# Patient Record
Sex: Female | Born: 1995 | Race: Black or African American | Hispanic: No | Marital: Single | Smoking: Never smoker
Health system: Southern US, Community
[De-identification: ages and names within clinical notes are randomized; demographics above are authoritative.]

---
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
## 2020-11-24 ENCOUNTER — Other Ambulatory Visit: Payer: Self-pay

## 2020-11-24 ENCOUNTER — Encounter (HOSPITAL_COMMUNITY): Payer: Self-pay

## 2020-11-24 ENCOUNTER — Emergency Department (HOSPITAL_COMMUNITY)
Admission: EM | Admit: 2020-11-24 | Discharge: 2020-11-24 | Disposition: A | Discharge status: Home / Self Care | Payer: 59 | Attending: Emergency Medicine | Admitting: Emergency Medicine

## 2020-11-24 ENCOUNTER — Emergency Department (HOSPITAL_COMMUNITY): Payer: 59

## 2020-11-24 DIAGNOSIS — X19XXXA Contact with other heat and hot substances, initial encounter: Secondary | ICD-10-CM | POA: Insufficient documentation

## 2020-11-24 DIAGNOSIS — M5442 Lumbago with sciatica, left side: Secondary | ICD-10-CM

## 2020-11-24 DIAGNOSIS — U071 COVID-19: Secondary | ICD-10-CM | POA: Insufficient documentation

## 2020-11-24 DIAGNOSIS — T2122XA Burn of second degree of abdominal wall, initial encounter: Secondary | ICD-10-CM | POA: Diagnosis not present

## 2020-11-24 DIAGNOSIS — R0981 Nasal congestion: Secondary | ICD-10-CM | POA: Diagnosis present

## 2020-11-24 LAB — URINALYSIS, ROUTINE W REFLEX MICROSCOPIC
Bilirubin Urine: NEGATIVE
Glucose, UA: NEGATIVE mg/dL
Hgb urine dipstick: NEGATIVE
Ketones, ur: NEGATIVE mg/dL
Leukocytes,Ua: NEGATIVE
Nitrite: NEGATIVE
Protein, ur: NEGATIVE mg/dL
Specific Gravity, Urine: 1.012 (ref 1.005–1.030)
pH: 5 (ref 5.0–8.0)

## 2020-11-24 LAB — PREGNANCY, URINE: Preg Test, Ur: NEGATIVE

## 2020-11-24 LAB — SARS CORONAVIRUS 2 (TAT 6-24 HRS): SARS Coronavirus 2: POSITIVE — AB

## 2020-11-24 MED ORDER — IBUPROFEN 800 MG PO TABS
800.0000 mg | ORAL_TABLET | Freq: Once | ORAL | Status: AC
  Administered 2020-11-24: 800 mg via ORAL
  Filled 2020-11-24: qty 1

## 2020-11-24 MED ORDER — CEPHALEXIN 500 MG PO CAPS: 500.0000 mg | ORAL_CAPSULE | Freq: Four times a day (QID) | ORAL | 0 refills | Status: AC

## 2020-11-24 MED ORDER — METHOCARBAMOL 500 MG PO TABS: 500.0000 mg | ORAL_TABLET | Freq: Two times a day (BID) | ORAL | 0 refills | Status: AC

## 2020-11-24 NOTE — ED Triage Notes (Signed)
Pt to ED by POV from home with c/o lumbar back pain, radiating down the back of both legs. Pt states this started yesterday afternoon. Denies any injury or incontinence. Arrives A+O, VSS, NADN.

## 2020-11-24 NOTE — ED Provider Notes (Signed)
Emergency Medicine Provider Triage Evaluation Note  Diana Perkins , a 25 y.o. female  was evaluated in triage.  Pt complains of bilateral low back pain onset 2 PM after a nap.  Patient reports several days of nasal congestion body aches and cough.  Denies known COVID contacts.  Patient reports that pain radiates into her right leg down towards her knee.  Back pain is unchanged with movement or palpation.  Patient has been taking acetaminophen and Alka-Seltzer for her nasal congestion and body aches.  Review of Systems  Positive: No back pain, nasal congestion, body aches, cough Negative: Fever, chills, numbness, tingling, weakness, loss of bowel or bladder control  Physical Exam  BP 108/74   Pulse 74   Temp 98.6 F (37 C)   Resp 18   Ht 5\' 11"  (1.803 m)   Wt 54.4 kg   LMP 11/01/2020   SpO2 97%   BMI 16.74 kg/m   Gen:   Awake, no distress   Resp:  Normal effort  MSK:   Moves extremities without difficulty, normal gait Other:  Nasal congestion  Medical Decision Making  Medically screening exam initiated at 5:08 AM.  Appropriate orders placed.  Diana Perkins was informed that the remainder of the evaluation will be completed by another provider, this initial triage assessment does not replace that evaluation, and the importance of remaining in the ED until their evaluation is complete.  Acute bilateral low back pain with left-sided sciatica    Sharol Harness 11/24/20 11/26/20    [id number], MD 11/25/20 [phone]

## 2020-11-24 NOTE — Discharge Instructions (Signed)
At this time there does not appear to be the presence of an emergent medical condition, however there is always the potential for conditions to change. Please read and follow the below instructions.  Please return to the Emergency Department immediately for any new or worsening symptoms . Please be sure to follow up with your Primary Care Provider within one week regarding your visit today; please call their office to schedule an appointment even if you are feeling better for a follow-up visit. You may use the muscle relaxer Robaxin as prescribed to help with your symptoms.  Do not drive or operate heavy machinery while taking Robaxin as it will make you drowsy.  Do not drink alcohol or take other sedating medications while taking Robaxin as this will worsen side effects. Please take your antibiotic Keflex as prescribed until complete to help with your symptoms.  Please drink enough water to avoid dehydration and get plenty of rest. Please apply topical antibiotics like Neosporin to your wound and rinse gently with soap and water daily. Your COVID/influenza panel should result in the next 1 day.  Please discuss these results with your primary care provider.  Go to the nearest Emergency Department immediately if: You have fever or chills You have a red streak of skin near the area around your wound. Your wound has been closed with staples, sutures, skin glue, or adhesive strips and it begins to open up and separate. Your wound is bleeding, and the bleeding does not stop with gentle pressure. You have a rash. You faint. You have trouble breathing. You cannot control when you pee (urinate) or poop (have a bowel movement). You have weakness in any of these areas and it gets worse: Lower back. The area between your hip bones. Butt. Legs. You have redness or swelling of your back. You have a burning feeling when you pee. You have any new/concerning or worsening of symptoms   Please read the  additional information packets attached to your discharge summary.  Do not take your medicine if  develop an itchy rash, swelling in your mouth or lips, or difficulty breathing; call 911 and seek immediate emergency medical attention if this occurs.  You may review your lab tests and imaging results in their entirety on your MyChart account.  Please discuss all results of fully with your primary care provider and other specialist at your follow-up visit.  Note: Portions of this text may have been transcribed using voice recognition software. Every effort was made to ensure accuracy; however, inadvertent computerized transcription errors may still be present.

## 2020-11-24 NOTE — ED Provider Notes (Signed)
[facility]-EMERGENCY DEPT Provider Note   CSN: [id number] Arrival date & time: 11/24/20  0357     History Chief Complaint  Patient presents with  . Back Pain    Diana Perkins is a 24 y.o. female otherwise healthy female presents today for evaluation of right-sided low back pain onset 2 days ago.  Describes pain as sharp aching occasionally radiating down the right leg towards the knee, pain improves with Tylenol and OTC medications, worsens with certain movements.  Additionally patient reports that she has been experiencing nasal congestion and sneezing over the past few days.   Additionally patient would like a wound on her left flank evaluated.  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  Patient reports the area has been scabbing.  Denies fever/chills, fall/injury, cough/shortness of breath, chest pain, numbness/tingling, weakness, saddle or paresthesias, bowel/bladder incontinence, urinary retention, IV drug use or any additional concerns  HPI     History reviewed. No pertinent past medical history.  There are no problems to display for this patient.   History reviewed. No pertinent surgical history.   OB History   No obstetric history on file.     No family history on file.  Social History   Tobacco Use  . Smoking status: Never Smoker  . Smokeless tobacco: Never Used  Substance Use Topics  . Alcohol use: Yes  . Drug use: Never    Home Medications Prior to Admission medications   Medication Sig Start Date End Date Taking? Authorizing Provider  acetaminophen (TYLENOL) 325 MG tablet Take 650 mg by mouth every 6 (six) hours as needed for mild pain.   Yes [provider]  aspirin-sod bicarb-citric acid (ALKA-SELTZER) 325 MG TBEF tablet Take 325 mg by mouth every 6 (six) hours as needed (multiple symptoms).   Yes [provider]  cephALEXin (KEFLEX) 500 MG capsule Take  1 capsule (500 mg total) by mouth 4 (four) times daily for 7 days. 11/24/20 12/01/20 Yes Harlene Salts A, PA-C  methocarbamol (ROBAXIN) 500 MG tablet Take 1 tablet (500 mg total) by mouth 2 (two) times daily for 5 days. 11/24/20 11/29/20 Yes Harlene Salts A, PA-C    Allergies    Patient has no known allergies.  Review of Systems   Review of Systems Ten systems are reviewed and are negative for acute change except as noted in the HPI  Physical Exam Updated Vital Signs BP 108/81   Pulse 62   Temp 98.5 F (36.9 C)   Resp 15   Ht 5\' 11"  (1.803 m)   Wt 54.4 kg   LMP 11/01/2020   SpO2 96%   BMI 16.74 kg/m   Physical Exam Constitutional:      General: She is not in acute distress.    Appearance: Normal appearance. She is well-developed. She is not ill-appearing or diaphoretic.  HENT:     Head: Normocephalic and atraumatic.  Eyes:     General: Vision grossly intact. Gaze aligned appropriately.     Pupils: Pupils are equal, round, and reactive to light.  Neck:     Trachea: Trachea and phonation normal.  Cardiovascular:     Rate and Rhythm: Normal rate and regular rhythm.     Pulses:          Dorsalis pedis pulses are 2+ on the right side and 2+ on the left side.  Pulmonary:     Effort: Pulmonary effort  is normal. No respiratory distress.  Abdominal:     General: There is no distension.     Palpations: Abdomen is soft.     Tenderness: There is no abdominal tenderness. There is no guarding or rebound.  Musculoskeletal:        General: Normal range of motion.     Cervical back: Normal range of motion.     Comments: No midline C/T/L spinal tenderness to palpation, no deformity, crepitus, or step-off noted. No sign of injury to the neck or back. - Mild bilateral lumbar paraspinal muscular tenderness to palpation.  Feet:     Right foot:     Protective Sensation: 5 sites tested. 5 sites sensed.     Left foot:     Protective Sensation: 5 sites tested. 5 sites sensed.  Skin:     General: Skin is warm and dry.          Comments: Brand at the left flank, please refer to picture below.  Minimal erythema.  Scabbing present  Neurological:     Mental Status: She is alert.     GCS: GCS eye subscore is 4. GCS verbal subscore is 5. GCS motor subscore is 6.     Comments: Speech is clear and goal oriented, follows commands Major Cranial nerves without deficit, no facial droop Normal strength in upper and lower extremities bilaterally including dorsiflexion and plantar flexion, strong and equal grip strength Sensation normal to light and sharp touch Moves extremities without ataxia, coordination intact No clonus of the feet.  Psychiatric:        Behavior: Behavior normal.       ED Results / Procedures / Treatments   Labs (all labs ordered are listed, but only abnormal results are displayed) Labs Reviewed  SARS CORONAVIRUS 2 (TAT 6-24 HRS)  URINALYSIS, ROUTINE W REFLEX MICROSCOPIC  PREGNANCY, URINE    EKG None  Radiology DG Lumbar Spine Complete  Result Date: 11/24/2020 CLINICAL DATA:  Low back pain.  No known injury. EXAM: LUMBAR SPINE - COMPLETE 4+ VIEW COMPARISON:  None. FINDINGS: There is no evidence of lumbar spine fracture. Alignment is normal. Intervertebral disc spaces are maintained. IMPRESSION: Negative. Electronically Signed   By: Charlett Nose M.D.   On: 11/24/2020 08:29    Procedures Procedures   Medications Ordered in ED Medications  ibuprofen (ADVIL) tablet 800 mg (800 mg Oral Given 11/24/20 5361)    ED Course  I have reviewed the triage vital signs and the nursing notes.  Pertinent labs & imaging results that were available during my care of the patient were reviewed by me and considered in my medical decision making (see chart for details).    MDM Rules/Calculators/A&P                         Additional history obtained from: 1. Nursing notes from this visit. 2. Family, mother at bedside.  Diana Perkins is a 25 y.o. female  presenting with bilateral lower back pain with occasional right-sided sciatica.  Pain began 2 days ago.  Patient denies history of trauma, fever, IV drug use, night sweats, weight loss, cancer, saddle anesthesia, urinary rentention, bowel/bladder incontinence. No neurological deficits and normal neuro exam.  Patient had x-ray of the lumbar spine performed by previous provider, radiology interpretation as negative.  I personally reviewed those lumbar spine films and agree with radiologist, no obvious fracture or dislocation.  Additionally patient had urinalysis which was within normal limits,  no hemoglobin.  Pregnancy test was negative.  Pain is consistently reproducible with palpation of the paraspinal lumbar musculature.  Low suspicion for spinal epidural abscess, cauda equina, AAA, kidney stone disease or other emergent pathologies.  No indication for MRI at this time.  Patient reports pain is greatly improved since receiving ibuprofen by previous provider and would like discharge, feel this course of action is appropriate.  She will continue use of OTC anti-inflammatories, we will also provide course of Robaxin, discussed muscle lectures precautions with patient and she stated understanding.  Additionally patient has been experiencing some some sneezing and nasal congestion and requested COVID test.  That test is currently pending she denies any chest pain shortness of breath cough or hemoptysis.  Patient will follow up on her COVID/influenza panel on her MyChart account later on today and discussed those results with her primary care provider.  She does not wish to stay in the emergency department for those results which I feel is reasonable.  Finally patient branded herself for her sorority last week, no significant cellulitis is present to the area, some mild erythema may be early cellulitis.  Will start patient on course of Keflex and encourage close PCP follow-up for reevaluation and to ensure healing.   Also advised patient provide proper wound care and apply topical antibiotics to the area.  Patient reports Tdap is up-to-date.  At this time there does not appear to be any evidence of an acute emergency medical condition and the patient appears stable for discharge with appropriate outpatient follow up. Diagnosis was discussed with patient who verbalizes understanding of care plan and is agreeable to discharge. I have discussed return precautions with patient and mother who verbalizes understanding. Patient encouraged to follow-up with their PCP. All questions answered.   Note: Portions of this report may have been transcribed using voice recognition software. Every effort was made to ensure accuracy; however, inadvertent computerized transcription errors may still be present. Final Clinical Impression(s) / ED Diagnoses Final diagnoses:  Acute bilateral low back pain with left-sided sciatica  Partial thickness burn of flank, initial encounter    Rx / DC Orders ED Discharge Orders         Ordered    cephALEXin (KEFLEX) 500 MG capsule  4 times daily        11/24/20 0945    methocarbamol (ROBAXIN) 500 MG tablet  2 times daily        11/24/20 0945           Elizabeth Palau 11/24/20 0948    Wynetta Fines, MD 11/25/20 [phone]

## 2020-11-25 ENCOUNTER — Telehealth (HOSPITAL_COMMUNITY): Payer: Self-pay

## 2021-07-16 ENCOUNTER — Other Ambulatory Visit: Payer: Self-pay

## 2021-07-16 ENCOUNTER — Emergency Department (HOSPITAL_BASED_OUTPATIENT_CLINIC_OR_DEPARTMENT_OTHER): Payer: 59

## 2021-07-16 ENCOUNTER — Emergency Department (HOSPITAL_BASED_OUTPATIENT_CLINIC_OR_DEPARTMENT_OTHER)
Admission: EM | Admit: 2021-07-16 | Discharge: 2021-07-16 | Disposition: A | Discharge status: Home / Self Care | Payer: 59 | Attending: Emergency Medicine | Admitting: Emergency Medicine

## 2021-07-16 ENCOUNTER — Encounter (HOSPITAL_BASED_OUTPATIENT_CLINIC_OR_DEPARTMENT_OTHER): Payer: Self-pay | Admitting: Emergency Medicine

## 2021-07-16 DIAGNOSIS — R112 Nausea with vomiting, unspecified: Secondary | ICD-10-CM | POA: Diagnosis present

## 2021-07-16 DIAGNOSIS — Z20822 Contact with and (suspected) exposure to covid-19: Secondary | ICD-10-CM | POA: Diagnosis not present

## 2021-07-16 DIAGNOSIS — Z79899 Other long term (current) drug therapy: Secondary | ICD-10-CM | POA: Insufficient documentation

## 2021-07-16 DIAGNOSIS — R1084 Generalized abdominal pain: Secondary | ICD-10-CM | POA: Diagnosis not present

## 2021-07-16 DIAGNOSIS — R000 Tachycardia, unspecified: Secondary | ICD-10-CM | POA: Insufficient documentation

## 2021-07-16 DIAGNOSIS — E872 Acidosis, unspecified: Secondary | ICD-10-CM | POA: Diagnosis not present

## 2021-07-16 LAB — CBC WITH DIFFERENTIAL/PLATELET
Abs Immature Granulocytes: 0.01 10*3/uL (ref 0.00–0.07)
Basophils Absolute: 0 10*3/uL (ref 0.0–0.1)
Basophils Relative: 0 %
Eosinophils Absolute: 0 10*3/uL (ref 0.0–0.5)
Eosinophils Relative: 0 %
HCT: 38.8 % (ref 36.0–46.0)
Hemoglobin: 13.8 g/dL (ref 12.0–15.0)
Immature Granulocytes: 0 %
Lymphocytes Relative: 22 %
Lymphs Abs: 1.1 10*3/uL (ref 0.7–4.0)
MCH: 28.8 pg (ref 26.0–34.0)
MCHC: 35.6 g/dL (ref 30.0–36.0)
MCV: 81 fL (ref 80.0–100.0)
Monocytes Absolute: 0.3 10*3/uL (ref 0.1–1.0)
Monocytes Relative: 6 %
Neutro Abs: 3.5 10*3/uL (ref 1.7–7.7)
Neutrophils Relative %: 72 %
Platelets: 327 10*3/uL (ref 150–400)
RBC: 4.79 MIL/uL (ref 3.87–5.11)
RDW: 12.8 % (ref 11.5–15.5)
WBC: 4.9 10*3/uL (ref 4.0–10.5)
nRBC: 0 % (ref 0.0–0.2)

## 2021-07-16 LAB — I-STAT VENOUS BLOOD GAS, ED
Acid-base deficit: 1 mmol/L (ref 0.0–2.0)
Bicarbonate: 24.2 mmol/L (ref 20.0–28.0)
Calcium, Ion: 1.05 mmol/L — ABNORMAL LOW (ref 1.15–1.40)
HCT: 39 % (ref 36.0–46.0)
Hemoglobin: 13.3 g/dL (ref 12.0–15.0)
O2 Saturation: 60 %
Patient temperature: 98.4
Potassium: 3.7 mmol/L (ref 3.5–5.1)
Sodium: 140 mmol/L (ref 135–145)
TCO2: 25 mmol/L (ref 22–32)
pCO2, Ven: 40.2 mmHg — ABNORMAL LOW (ref 44.0–60.0)
pH, Ven: 7.387 (ref 7.250–7.430)
pO2, Ven: 32 mmHg (ref 32.0–45.0)

## 2021-07-16 LAB — RAPID URINE DRUG SCREEN, HOSP PERFORMED
Amphetamines: NOT DETECTED
Barbiturates: NOT DETECTED
Benzodiazepines: NOT DETECTED
Cocaine: NOT DETECTED
Opiates: NOT DETECTED
Tetrahydrocannabinol: POSITIVE — AB

## 2021-07-16 LAB — URINALYSIS, ROUTINE W REFLEX MICROSCOPIC
Bilirubin Urine: NEGATIVE
Glucose, UA: NEGATIVE mg/dL
Hgb urine dipstick: NEGATIVE
Ketones, ur: 80 mg/dL — AB
Leukocytes,Ua: NEGATIVE
Nitrite: NEGATIVE
Protein, ur: NEGATIVE mg/dL
Specific Gravity, Urine: 1.02 (ref 1.005–1.030)
pH: 8.5 — ABNORMAL HIGH (ref 5.0–8.0)

## 2021-07-16 LAB — COMPREHENSIVE METABOLIC PANEL
ALT: 42 U/L (ref 0–44)
AST: 42 U/L — ABNORMAL HIGH (ref 15–41)
Albumin: 4.6 g/dL (ref 3.5–5.0)
Alkaline Phosphatase: 51 U/L (ref 38–126)
Anion gap: 16 — ABNORMAL HIGH (ref 5–15)
BUN: 7 mg/dL (ref 6–20)
CO2: 20 mmol/L — ABNORMAL LOW (ref 22–32)
Calcium: 8.9 mg/dL (ref 8.9–10.3)
Chloride: 102 mmol/L (ref 98–111)
Creatinine, Ser: 0.92 mg/dL (ref 0.44–1.00)
GFR, Estimated: >60 mL/min (ref >60)
Glucose, Bld: 125 mg/dL — ABNORMAL HIGH (ref 70–99)
Potassium: 3.6 mmol/L (ref 3.5–5.1)
Sodium: 138 mmol/L (ref 135–145)
Total Bilirubin: 1.5 mg/dL — ABNORMAL HIGH (ref 0.3–1.2)
Total Protein: 7.6 g/dL (ref 6.5–8.1)

## 2021-07-16 LAB — RESP PANEL BY RT-PCR (FLU A&B, COVID) ARPGX2
Influenza A by PCR: NEGATIVE
Influenza B by PCR: NEGATIVE
SARS Coronavirus 2 by RT PCR: NEGATIVE

## 2021-07-16 LAB — LACTIC ACID, PLASMA
Lactic Acid, Venous: 3.7 mmol/L (ref 0.5–1.9)
Lactic Acid, Venous: 1.5 mmol/L (ref 0.5–1.9)

## 2021-07-16 LAB — CBG MONITORING, ED: Glucose-Capillary: 119 mg/dL — ABNORMAL HIGH (ref 70–99)

## 2021-07-16 LAB — LIPASE, BLOOD: Lipase: 36 U/L (ref 11–51)

## 2021-07-16 LAB — MAGNESIUM: Magnesium: 1.6 mg/dL — ABNORMAL LOW (ref 1.7–2.4)

## 2021-07-16 LAB — TROPONIN I (HIGH SENSITIVITY)
Troponin I (High Sensitivity): <2 ng/L (ref <18)
Troponin I (High Sensitivity): 3 ng/L (ref <18)

## 2021-07-16 LAB — PREGNANCY, URINE: Preg Test, Ur: NEGATIVE

## 2021-07-16 MED ORDER — PROMETHAZINE HCL 25 MG/ML IJ SOLN
INTRAMUSCULAR | Status: AC
  Filled 2021-07-16: qty 1

## 2021-07-16 MED ORDER — LACTATED RINGERS IV BOLUS
1000.0000 mL | Freq: Once | INTRAVENOUS | Status: AC
Start: 2021-07-16 — End: 2021-07-16
  Administered 2021-07-16: 1000 mL via INTRAVENOUS

## 2021-07-16 MED ORDER — DROPERIDOL 2.5 MG/ML IJ SOLN
2.5000 mg | Freq: Once | INTRAMUSCULAR | Status: AC
  Administered 2021-07-16: 2.5 mg via INTRAVENOUS
  Filled 2021-07-16: qty 2

## 2021-07-16 MED ORDER — SODIUM CHLORIDE 0.9 % IV BOLUS
1000.0000 mL | Freq: Once | INTRAVENOUS | Status: AC
  Administered 2021-07-16: 1000 mL via INTRAVENOUS

## 2021-07-16 MED ORDER — MAGNESIUM SULFATE 2 GM/50ML IV SOLN
2.0000 g | Freq: Once | INTRAVENOUS | Status: AC
  Administered 2021-07-16: 2 g via INTRAVENOUS
  Filled 2021-07-16: qty 50

## 2021-07-16 MED ORDER — ONDANSETRON HCL 4 MG/2ML IJ SOLN
4.0000 mg | Freq: Once | INTRAMUSCULAR | Status: AC
  Administered 2021-07-16: 4 mg via INTRAVENOUS
  Filled 2021-07-16: qty 2

## 2021-07-16 MED ORDER — IOHEXOL 300 MG/ML  SOLN
100.0000 mL | Freq: Once | INTRAMUSCULAR | Status: AC | PRN
  Administered 2021-07-16: 80 mL via INTRAVENOUS

## 2021-07-16 MED ORDER — ONDANSETRON 4 MG PO TBDP: 4.0000 mg | ORAL_TABLET | Freq: Three times a day (TID) | ORAL | 0 refills | Status: DC | PRN

## 2021-07-16 MED ORDER — SODIUM CHLORIDE 0.9 % IV SOLN
12.5000 mg | Freq: Once | INTRAVENOUS | Status: AC
  Administered 2021-07-16: 12.5 mg via INTRAVENOUS
  Filled 2021-07-16: qty 0.5

## 2021-07-16 NOTE — Discharge Instructions (Addendum)
It was our pleasure to provide your ER care today - we hope that you feel better.  As we discussed we suspect your vomiting is likely secondary to marijuana use.  You should stop using this and follow-up with your primary doctor.  He may need to see a gastroenterologist if your vomiting continues.  Take the anti-nausea medication as prescribed.  Avoid alcohol, caffeine, NSAID medications, spicy foods. Return to the ED with intractable pain, intractable vomiting, black or bloody stools, dizziness or lightheadedness or any other concerns.

## 2021-07-16 NOTE — ED Provider Notes (Signed)
Signed out that workup done and d/c instructions complete, pt receiving fluids, to d/c home if no recurrent/persistent vomiting.   Pt notes feels improved. No current pain or nv.   Patient appears stable for d/c.   Return precautions provided.      Lajean Saver, MD 07/16/21 [phone]

## 2021-07-16 NOTE — ED Triage Notes (Signed)
Reports n/v that comes and goes since christmas eve.

## 2021-07-16 NOTE — ED Notes (Signed)
Patient resting in bed with eyes closed but arousable to voice.  PO fluids offered.  Patient appears disinterested and continues to rest with eyes closed.

## 2021-07-16 NOTE — ED Provider Notes (Addendum)
[facility] EMERGENCY DEPARTMENT Provider Note   CSN: [id number] Arrival date & time: 07/16/21  0149     History  Chief Complaint  Patient presents with   Vomiting    Diana Perkins is a 26 y.o. female.  Patient with no chronic medical conditions presenting with intermittent nausea and vomiting since December 25.  States she had multiple episodes of vomiting on December 24 and 25 which seem to improve after 2 days.  However this symptoms returned on December 27 and she has been vomiting ever since.  Unable to quantify how many times she has been vomiting.  Reports it is not bloody.  Has not had any diarrhea.  States she is unable to eat or drink anything except intermittent chicken noodle soup.  Does not believe she is had a fever.  Does not think she had any diarrhea.  Does have some burning in her stomach and chest and vomiting.  No pain with urination or blood in the urine.  She does admit to recreational marijuana use.  Denies any possibility of pregnancy. Denies any travel or sick contacts. No history of GERD or ulcers. No recent antibiotic use. No previous abdominal surgeries.  No regular medications.  The history is provided by the patient and a relative.      Home Medications Prior to Admission medications   Medication Sig Start Date End Date Taking? Authorizing Provider  acetaminophen (TYLENOL) 325 MG tablet Take 650 mg by mouth every 6 (six) hours as needed for mild pain.    [provider]  aspirin-sod bicarb-citric acid (ALKA-SELTZER) 325 MG TBEF tablet Take 325 mg by mouth every 6 (six) hours as needed (multiple symptoms).    [provider]      Allergies    Amoxicillin    Review of Systems   Review of Systems  Constitutional:  Positive for activity change. Negative for appetite change, fatigue and fever.  HENT:  Negative for congestion and rhinorrhea.   Respiratory:  Negative for cough and shortness of breath.   Cardiovascular:   Negative for chest pain.  Gastrointestinal:  Positive for abdominal pain, nausea and vomiting. Negative for diarrhea.  Genitourinary:  Negative for dysuria and hematuria.  Musculoskeletal:  Negative for arthralgias and myalgias.  Skin:  Negative for rash.  Neurological:  Positive for weakness. Negative for dizziness and headaches.   all other systems are negative except as noted in the HPI and PMH.   Physical Exam Updated Vital Signs BP 138/80 (BP Location: Right Arm)    Pulse 65    Temp 98.4 F (36.9 C) (Oral)    Resp 20    Ht 5\' 11"  (1.803 m)    Wt 49.2 kg    LMP 07/08/2021    SpO2 100%    BMI 15.12 kg/m  Physical Exam Vitals and nursing note reviewed.  Constitutional:      General: She is not in acute distress.    Appearance: She is well-developed. She is ill-appearing.     Comments: Appears unwell, shaking chills  HENT:     Head: Normocephalic and atraumatic.     Mouth/Throat:     Mouth: Mucous membranes are dry.     Pharynx: No oropharyngeal exudate.  Eyes:     Conjunctiva/sclera: Conjunctivae normal.     Pupils: Pupils are equal, round, and reactive to light.  Neck:     Comments: No meningismus. Cardiovascular:     Rate and Rhythm: Regular rhythm. Tachycardia present.  Heart sounds: Normal heart sounds. No murmur heard. Pulmonary:     Effort: Pulmonary effort is normal. No respiratory distress.     Breath sounds: Normal breath sounds.  Abdominal:     Palpations: Abdomen is soft.     Tenderness: There is abdominal tenderness. There is no guarding or rebound.     Comments: Mild diffuse tenderness  Musculoskeletal:        General: No tenderness. Normal range of motion.     Cervical back: Normal range of motion and neck supple.  Skin:    General: Skin is warm.  Neurological:     Mental Status: She is alert and oriented to person, place, and time.     Cranial Nerves: No cranial nerve deficit.     Motor: No abnormal muscle tone.     Coordination: Coordination  normal.     Comments:  5/5 strength throughout. CN 2-12 intact.Equal grip strength.   Psychiatric:        Behavior: Behavior normal.    ED Results / Procedures / Treatments   Labs (all labs ordered are listed, but only abnormal results are displayed) Labs Reviewed  URINALYSIS, ROUTINE W REFLEX MICROSCOPIC - Abnormal; Notable for the following components:      Result Value   APPearance HAZY (*)    pH 8.5 (*)    Ketones, ur 80 (*)    All other components within normal limits  LACTIC ACID, PLASMA - Abnormal; Notable for the following components:   Lactic Acid, Venous 3.7 (*)    All other components within normal limits  COMPREHENSIVE METABOLIC PANEL - Abnormal; Notable for the following components:   CO2 20 (*)    Glucose, Bld 125 (*)    AST 42 (*)    Total Bilirubin 1.5 (*)    Anion gap 16 (*)    All other components within normal limits  RAPID URINE DRUG SCREEN, HOSP PERFORMED - Abnormal; Notable for the following components:   Tetrahydrocannabinol POSITIVE (*)    All other components within normal limits  MAGNESIUM - Abnormal; Notable for the following components:   Magnesium 1.6 (*)    All other components within normal limits  CBG MONITORING, ED - Abnormal; Notable for the following components:   Glucose-Capillary 119 (*)    All other components within normal limits  I-STAT VENOUS BLOOD GAS, ED - Abnormal; Notable for the following components:   pCO2, Ven 40.2 (*)    Calcium, Ion 1.05 (*)    All other components within normal limits  RESP PANEL BY RT-PCR (FLU A&B, COVID) ARPGX2  PREGNANCY, URINE  LACTIC ACID, PLASMA  CBC WITH DIFFERENTIAL/PLATELET  LIPASE, BLOOD  TROPONIN I (HIGH SENSITIVITY)  TROPONIN I (HIGH SENSITIVITY)    EKG EKG Interpretation  Date/Time:  Monday July 16 2021 02:27:12 EST Ventricular Rate:  62 PR Interval:  156 QRS Duration: 81 QT Interval:  439 QTC Calculation: 446 R Axis:   71 Text Interpretation: Sinus rhythm Nonspecific T  abnormalities, anterior leads No previous ECGs available Confirmed by Ezequiel Essex [phone]) on 07/16/2021 3:15:42 AM  Radiology DG Chest 2 View  Result Date: 07/16/2021 CLINICAL DATA:  Nausea and vomiting with cough. EXAM: CHEST - 2 VIEW COMPARISON:  None. FINDINGS: The heart size and mediastinal contours are within normal limits. Both lungs are clear. A radiopaque nipple piercing is seen on the left. The visualized skeletal structures are unremarkable. IMPRESSION: No active cardiopulmonary disease. Electronically Signed   By: Joyce Gross.D.  On: 07/16/2021 04:01   CT ABDOMEN PELVIS W CONTRAST  Result Date: 07/16/2021 CLINICAL DATA:  Nausea and vomiting. EXAM: CT ABDOMEN AND PELVIS WITH CONTRAST TECHNIQUE: Multidetector CT imaging of the abdomen and pelvis was performed using the standard protocol following bolus administration of intravenous contrast. CONTRAST:  80mL OMNIPAQUE IOHEXOL 300 MG/ML  SOLN COMPARISON:  None. FINDINGS: Lower chest: No acute abnormality. Hepatobiliary: 16.7 cm in length slightly steatotic liver. No mass enhancement. Unremarkable gallbladder and bile ducts. Pancreas: Unremarkable. No pancreatic ductal dilatation or surrounding inflammatory changes. Spleen: Normal. Adrenals/Urinary Tract: There is no adrenal mass no focal abnormality in the renal cortex and no calculus or hydroureteronephrosis. There is mild bladder thickening versus nondistention. Stomach/Bowel: There is fold thickening in the proximal and mid stomach. Fold thickening is seen in multiple mid to lower abdominal small bowel segments without dilatation. The appendix is normal caliber. There is wall thickening or underdistention in the descending colon. Vascular/Lymphatic: No significant vascular findings are present. No enlarged abdominal or pelvic lymph nodes. Reproductive: Uterus and bilateral adnexa are unremarkable. There is air in the vaginal vault, possibly due to a tampon. Other: No anterior wall hernia  is seen. There is a small amount of low-density fluid in the pelvic cul-de-sac. There is no free air, hemorrhage or abscess. Musculoskeletal: No acute or significant osseous findings. IMPRESSION: 1. Imaging findings of gastroenteritis. No small bowel obstruction or mesenteric focal inflammatory changes. 2. Descending colitis versus nondistention. 3. Minimal low-density fluid in the pelvic cul-de-sac. 4. Cystitis versus bladder nondistention. No findings of pyelonephritis. Electronically Signed   By: Almira BarKeith  Chesser M.D.   On: 07/16/2021 04:19    Procedures Procedures    Medications Ordered in ED Medications  sodium chloride 0.9 % bolus 1,000 mL (has no administration in time range)  ondansetron (ZOFRAN) injection 4 mg (has no administration in time range)    ED Course/ Medical Decision Making/ A&P                           Medical Decision Making Intermittent nausea and vomiting since December 27.  Vital stable.  Afebrile.  Abdomen soft without peritoneal signs.  Lactate elevated at 3.7.  Patient given aggressive IV hydration.  EKG does show T wave inversions anteriorly, no comparison. Mild anion gap acidosis with bicarb of 20 and gap of 16  IV fluids given.  Hypomagnesemia noted and replaced. Urinalysis with large ketones.  Drug screen is positive for marijuana only.  Imaging which was personally reviewed by me shows evidence of gastroenteritis without bowel obstruction.  Lactate has normalized on rechecked. Abdomen soft without peritoneal signs.   CT scan results above consistent with likely gastroenteritis versus colonic nondistention. Patient has not had any diarrhea so doubt colitis.  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 Advised cessation of marijuana products. Advise close PCP follow-up and antiemetics for home use. Patient did have some transient hypotension numbers in the 80s but this was likely positional and she was sleeping at the time.   Despite elevated lactate low suspicion for sepsis or septic shock.  Subsequent blood pressure numbers were normal.  On attempted discharge, patient began vomiting again and was given additional doses of antiemetics including droperidol. Care to be transferred at shift change. Dr. Denton LankSteinl to assume care.         Final Clinical Impression(s) / ED Diagnoses Final diagnoses:  Nausea and vomiting, unspecified vomiting type  Lactic acidosis  Rx / DC Orders ED Discharge Orders     None         Ledell Codrington, Annie Main, MD 07/16/21 Wonda Amis    Ezequiel Essex, MD 07/17/21 1032

## 2021-07-17 ENCOUNTER — Encounter (HOSPITAL_BASED_OUTPATIENT_CLINIC_OR_DEPARTMENT_OTHER): Payer: Self-pay | Admitting: Emergency Medicine

## 2021-07-17 ENCOUNTER — Emergency Department (HOSPITAL_BASED_OUTPATIENT_CLINIC_OR_DEPARTMENT_OTHER)
Admission: EM | Admit: 2021-07-17 | Discharge: 2021-07-17 | Disposition: A | Discharge status: Home / Self Care | Payer: 59 | Attending: Emergency Medicine | Admitting: Emergency Medicine

## 2021-07-17 ENCOUNTER — Encounter: Payer: Self-pay | Admitting: Gastroenterology

## 2021-07-17 ENCOUNTER — Other Ambulatory Visit: Payer: Self-pay

## 2021-07-17 DIAGNOSIS — K2900 Acute gastritis without bleeding: Secondary | ICD-10-CM

## 2021-07-17 DIAGNOSIS — R112 Nausea with vomiting, unspecified: Secondary | ICD-10-CM | POA: Diagnosis present

## 2021-07-17 LAB — CBC WITH DIFFERENTIAL/PLATELET
Abs Immature Granulocytes: 0.01 10*3/uL (ref 0.00–0.07)
Basophils Absolute: 0 10*3/uL (ref 0.0–0.1)
Basophils Relative: 0 %
Eosinophils Absolute: 0 10*3/uL (ref 0.0–0.5)
Eosinophils Relative: 0 %
HCT: 37.2 % (ref 36.0–46.0)
Hemoglobin: 13.3 g/dL (ref 12.0–15.0)
Immature Granulocytes: 0 %
Lymphocytes Relative: 26 %
Lymphs Abs: 1.7 10*3/uL (ref 0.7–4.0)
MCH: 29 pg (ref 26.0–34.0)
MCHC: 35.8 g/dL (ref 30.0–36.0)
MCV: 81 fL (ref 80.0–100.0)
Monocytes Absolute: 0.4 10*3/uL (ref 0.1–1.0)
Monocytes Relative: 6 %
Neutro Abs: 4.3 10*3/uL (ref 1.7–7.7)
Neutrophils Relative %: 68 %
Platelets: 317 10*3/uL (ref 150–400)
RBC: 4.59 MIL/uL (ref 3.87–5.11)
RDW: 12.7 % (ref 11.5–15.5)
WBC: 6.4 10*3/uL (ref 4.0–10.5)
nRBC: 0 % (ref 0.0–0.2)

## 2021-07-17 LAB — COMPREHENSIVE METABOLIC PANEL
ALT: 28 U/L (ref 0–44)
AST: 21 U/L (ref 15–41)
Albumin: 4.2 g/dL (ref 3.5–5.0)
Alkaline Phosphatase: 46 U/L (ref 38–126)
Anion gap: 13 (ref 5–15)
BUN: 7 mg/dL (ref 6–20)
CO2: 22 mmol/L (ref 22–32)
Calcium: 8.6 mg/dL — ABNORMAL LOW (ref 8.9–10.3)
Chloride: 100 mmol/L (ref 98–111)
Creatinine, Ser: 0.75 mg/dL (ref 0.44–1.00)
GFR, Estimated: >60 mL/min (ref >60)
Glucose, Bld: 73 mg/dL (ref 70–99)
Potassium: 3.3 mmol/L — ABNORMAL LOW (ref 3.5–5.1)
Sodium: 135 mmol/L (ref 135–145)
Total Bilirubin: 1.3 mg/dL — ABNORMAL HIGH (ref 0.3–1.2)
Total Protein: 7.1 g/dL (ref 6.5–8.1)

## 2021-07-17 LAB — MAGNESIUM: Magnesium: 1.8 mg/dL (ref 1.7–2.4)

## 2021-07-17 LAB — LIPASE, BLOOD: Lipase: 40 U/L (ref 11–51)

## 2021-07-17 MED ORDER — SODIUM CHLORIDE 0.9 % IV BOLUS
1000.0000 mL | Freq: Once | INTRAVENOUS | Status: AC
  Administered 2021-07-17: 1000 mL via INTRAVENOUS

## 2021-07-17 MED ORDER — POTASSIUM CHLORIDE CRYS ER 20 MEQ PO TBCR
40.0000 meq | EXTENDED_RELEASE_TABLET | Freq: Once | ORAL | Status: AC
Start: 2021-07-17 — End: 2021-07-17
  Administered 2021-07-17: 40 meq via ORAL
  Filled 2021-07-17: qty 2

## 2021-07-17 MED ORDER — DROPERIDOL 2.5 MG/ML IJ SOLN
2.5000 mg | Freq: Once | INTRAMUSCULAR | Status: AC
  Administered 2021-07-17: 2.5 mg via INTRAVENOUS
  Filled 2021-07-17: qty 2

## 2021-07-17 NOTE — ED Triage Notes (Signed)
Pt here for continued NV since 12/24

## 2021-07-17 NOTE — ED Notes (Signed)
ED Provider at bedside. 

## 2021-07-17 NOTE — Discharge Instructions (Signed)
As we discussed please continue to try to drink fluids as you are able to tolerate, you can take the Zofran slightly more often than every 8 hours, you can try to take it every 4 hours, or double up on a single dose to help with nausea and vomiting at this time.  Please follow-up with the GI doctor in the morning for further evaluation.  If your condition worsens, your stomach pain gets significantly more intense, you begin to notice active bleeding from your vomit, or from your rectum I recommend you return to the emergency department for further evaluation.

## 2021-07-17 NOTE — ED Provider Notes (Signed)
[facility] EMERGENCY DEPARTMENT Provider Note   CSN: [id number] Arrival date & time: 07/17/21  [id number]     History  Chief Complaint  Patient presents with   Emesis    Diana Perkins is a 26 y.o. female with no significant past medical history who presents with continued intractable nausea, vomiting.  Patient was seen and evaluated for the same problem 2 days ago.  At this time patient reported that she has had intermittent nausea and vomiting since Christmas, with multiple episodes of vomiting in the 2 days after Christmas, with return of symptoms a few days later.  Patient reports that she has vomited dozens of time, denies blood in vomit.  Patient denies any diarrhea.  Patient reports that she has not had anything to eat or drink other than some chicken noodle soup.  Patient does not feel any fever or chills.  Patient does report that she has had some burning in her upper stomach.  Patient denies any pain with urination, vaginal bleeding, blood in urine, vaginal discharge.  Patient does report that she has occasional recreational marijuana use, however she reports that she has been using for only around a year, and she is not an everyday user.  She does not have any relief of symptoms with hot shower.  Patient does report that she had a history of acid reflux, with multiple endoscopies when she was a child, with resolution of her symptoms spontaneously.  Patient denies any recent use of antibiotics.  Interval change: After discharge patient reports that she went to sleep, was not awoken for sleep by nausea, vomiting however upon awakening she had more intractable nausea, vomiting after her Zofran wore off.  Patient does report that she has had some improvement with Zofran, but she can only take it every 8 hours and so it is not covering her symptoms.  Patient reports that at this time she is having some mild nausea, and retching.  Patient reports that she does have an appointment now  scheduled for gastroenterologist at 9 AM tomorrow.   Emesis     Home Medications Prior to Admission medications   Medication Sig Start Date End Date Taking? Authorizing Provider  acetaminophen (TYLENOL) 325 MG tablet Take 650 mg by mouth every 6 (six) hours as needed for mild pain.    [provider]  aspirin-sod bicarb-citric acid (ALKA-SELTZER) 325 MG TBEF tablet Take 325 mg by mouth every 6 (six) hours as needed (multiple symptoms).    [provider]  ondansetron (ZOFRAN-ODT) 4 MG disintegrating tablet Take 1 tablet (4 mg total) by mouth every 8 (eight) hours as needed for nausea or vomiting. 07/16/21   Ezequiel Essex, MD      Allergies    Amoxicillin    Review of Systems   Review of Systems  Gastrointestinal:  Positive for vomiting.   Physical Exam Updated Vital Signs BP 112/80    Pulse 71    Temp 98.4 F (36.9 C) (Oral)    Resp 16    Ht 5\' 11"  (1.803 m)    Wt 49 kg    LMP 07/08/2021    SpO2 100%    BMI 15.06 kg/m  Physical Exam  ED Results / Procedures / Treatments   Labs (all labs ordered are listed, but only abnormal results are displayed) Labs Reviewed  COMPREHENSIVE METABOLIC PANEL - Abnormal; Notable for the following components:      Result Value   Potassium 3.3 (*)    Calcium  8.6 (*)    Total Bilirubin 1.3 (*)    All other components within normal limits  CBC WITH DIFFERENTIAL/PLATELET  LIPASE, BLOOD  MAGNESIUM    EKG None  Radiology DG Chest 2 View  Result Date: 07/16/2021 CLINICAL DATA:  Nausea and vomiting with cough. EXAM: CHEST - 2 VIEW COMPARISON:  None. FINDINGS: The heart size and mediastinal contours are within normal limits. Both lungs are clear. A radiopaque nipple piercing is seen on the left. The visualized skeletal structures are unremarkable. IMPRESSION: No active cardiopulmonary disease. Electronically Signed   By: Virgina Norfolk M.D.   On: 07/16/2021 04:01   CT ABDOMEN PELVIS W CONTRAST  Result Date:  07/16/2021 CLINICAL DATA:  Nausea and vomiting. EXAM: CT ABDOMEN AND PELVIS WITH CONTRAST TECHNIQUE: Multidetector CT imaging of the abdomen and pelvis was performed using the standard protocol following bolus administration of intravenous contrast. CONTRAST:  30mL OMNIPAQUE IOHEXOL 300 MG/ML  SOLN COMPARISON:  None. FINDINGS: Lower chest: No acute abnormality. Hepatobiliary: 16.7 cm in length slightly steatotic liver. No mass enhancement. Unremarkable gallbladder and bile ducts. Pancreas: Unremarkable. No pancreatic ductal dilatation or surrounding inflammatory changes. Spleen: Normal. Adrenals/Urinary Tract: There is no adrenal mass no focal abnormality in the renal cortex and no calculus or hydroureteronephrosis. There is mild bladder thickening versus nondistention. Stomach/Bowel: There is fold thickening in the proximal and mid stomach. Fold thickening is seen in multiple mid to lower abdominal small bowel segments without dilatation. The appendix is normal caliber. There is wall thickening or underdistention in the descending colon. Vascular/Lymphatic: No significant vascular findings are present. No enlarged abdominal or pelvic lymph nodes. Reproductive: Uterus and bilateral adnexa are unremarkable. There is air in the vaginal vault, possibly due to a tampon. Other: No anterior wall hernia is seen. There is a small amount of low-density fluid in the pelvic cul-de-sac. There is no free air, hemorrhage or abscess. Musculoskeletal: No acute or significant osseous findings. IMPRESSION: 1. Imaging findings of gastroenteritis. No small bowel obstruction or mesenteric focal inflammatory changes. 2. Descending colitis versus nondistention. 3. Minimal low-density fluid in the pelvic cul-de-sac. 4. Cystitis versus bladder nondistention. No findings of pyelonephritis. Electronically Signed   By: Telford Nab M.D.   On: 07/16/2021 04:19    Procedures Procedures    Medications Ordered in ED Medications  sodium  chloride 0.9 % bolus 1,000 mL (0 mLs Intravenous Stopped 07/17/21 1512)  droperidol (INAPSINE) 2.5 MG/ML injection 2.5 mg (2.5 mg Intravenous Given 07/17/21 1410)  potassium chloride SA (KLOR-CON M) CR tablet 40 mEq (40 mEq Oral Given 07/17/21 1520)    ED Course/ Medical Decision Making/ A&P Clinical Course as of 07/17/21 1526  Tue Jul 17, 2021  1447 Potassium(!): 3.3 We will replete orally [CP]    Clinical Course User Index [CP] Anselmo Pickler, PA-C                           Medical Decision Making  This is a patient who is returning after 2 days for continued intractable nausea, vomiting.  She had a thorough work-up 2 days ago including CT abdomen pelvis, lactic acid, CBC, CMP, urinalysis.  I ordered and independently reviewed the findings from previous visit with Dr. Ashok Cordia, and Dr. Wyvonnia Dusky or which showed patient having gastritis.  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  As she has only been smoking for around 1 year, and she is not an everyday  user I have less certainty that this is the presumptive diagnosis at this time.  I have independently reviewed her lab work today which shows that she has a mild hypokalemia of 3.3.  We will replete orally.   Minimally elevated T bili, unclear etiology for this finding. Slight hypocalcemia.  Her other lab work is overall unremarkable.  She is still having some generalized abdominal tenderness on exam, very she recently received a CT I do not believe that she requires any further imaging at this time.  Patient reports that she has some improvement of symptoms with fluids, and droperidol.  Discussed that she can take Zofran slightly more often than every 8 hours for her nausea, especially given that she has a GI appointment in the morning.  Discussed with patient and mother that it is unlikely that we will find any new explanation of her findings at this time, so I recommend that she follow-up with GI.   Patient mother agree to plan at this time.  Final Clinical Impression(s) / ED Diagnoses Final diagnoses:  Nausea and vomiting, unspecified vomiting type  Acute gastritis without hemorrhage, unspecified gastritis type    Rx / DC Orders ED Discharge Orders     None         Anselmo Pickler, PA-C 07/17/21 1526    Gareth Morgan, MD 07/17/21 2230

## 2021-08-16 ENCOUNTER — Ambulatory Visit: Payer: 59 | Admitting: Gastroenterology

## 2023-04-07 IMAGING — CT CT ABD-PELV W/ CM
2 of 4 series · 16 of 46 positions shown, 18 images · IV contrast (Omnipaque)
Comparison: None.

CLINICAL DATA: Nausea and vomiting.

EXAM:
CT ABDOMEN AND PELVIS WITH CONTRAST
TECHNIQUE: Multidetector CT imaging of the abdomen and pelvis was performed
using the standard protocol following bolus administration of
intravenous contrast.
CONTRAST:  80mL OMNIPAQUE IOHEXOL 300 MG/ML  SOLN

[Series 2: axial st · axial · 0.70mm/px · z∈[-457,-62]mm · 13 of 89 slices shown, 15 images]
[im 5/89  soft-tissue]
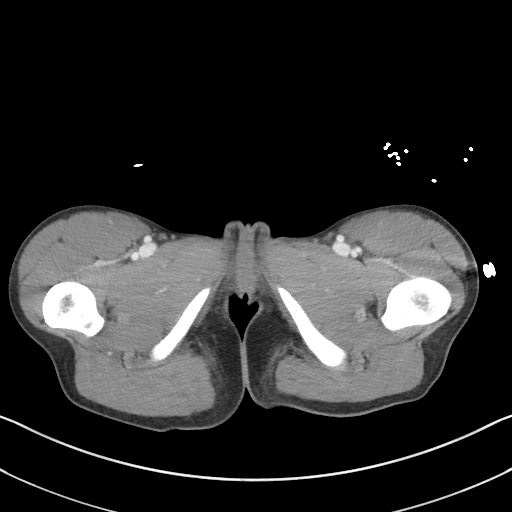
[im 5/89  bone]
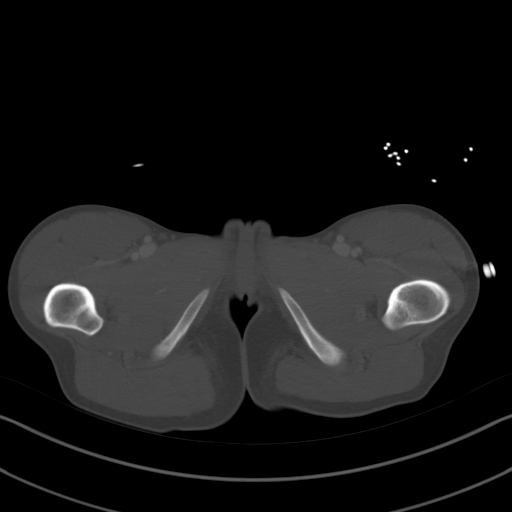
[im 13/89  soft-tissue]
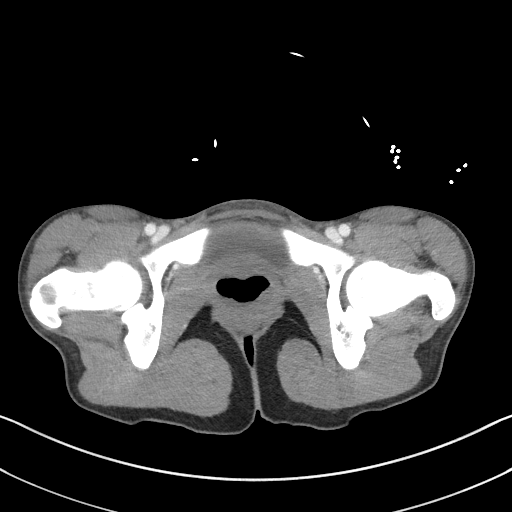
[im 17/89  soft-tissue]
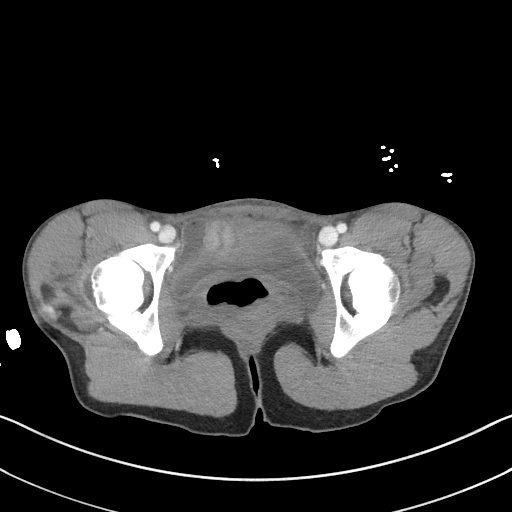
[im 26/89  soft-tissue]
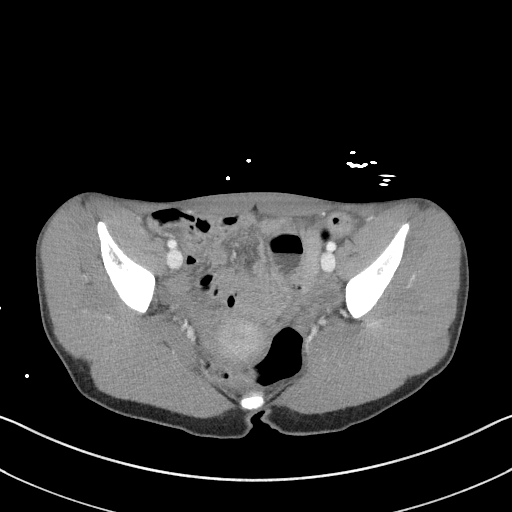
[im 30/89  soft-tissue]
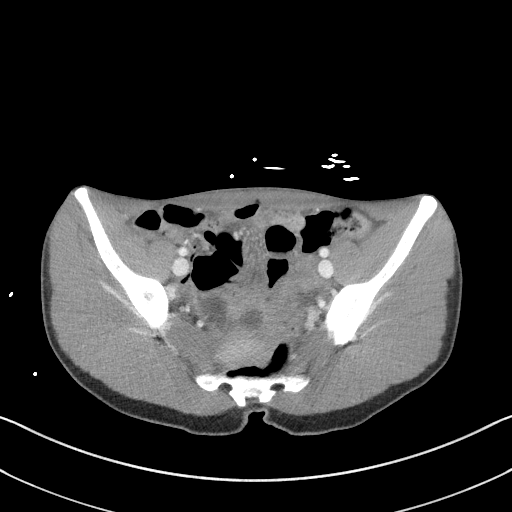
[im 38/89  soft-tissue]
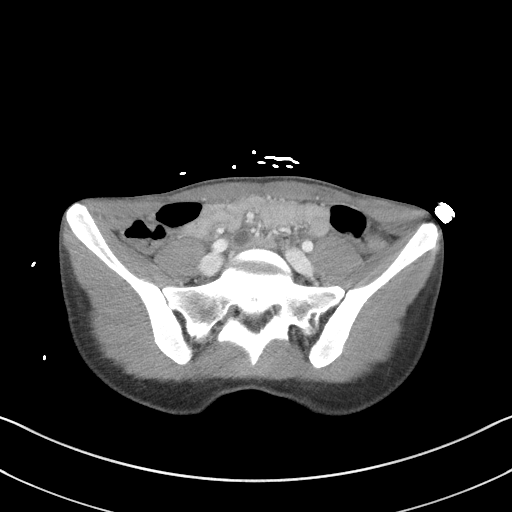
[im 47/89  soft-tissue]
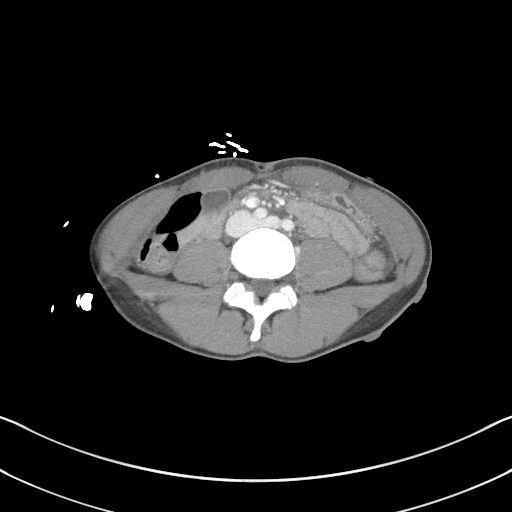
[im 51/89  soft-tissue]
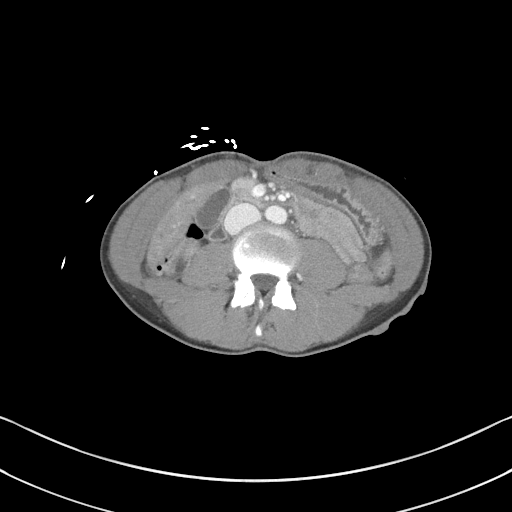
[im 59/89  soft-tissue]
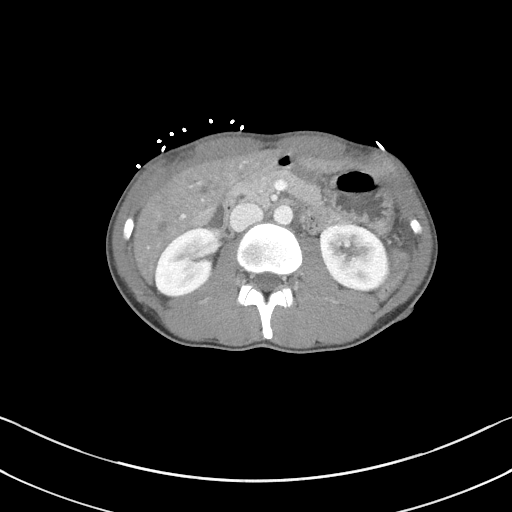
[im 59/89  bone]
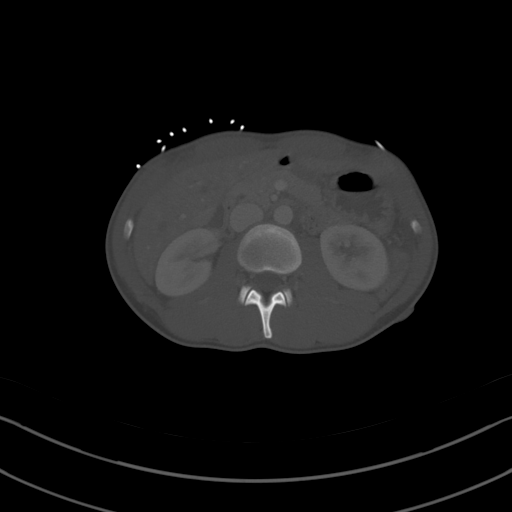
[im 63/89  soft-tissue]
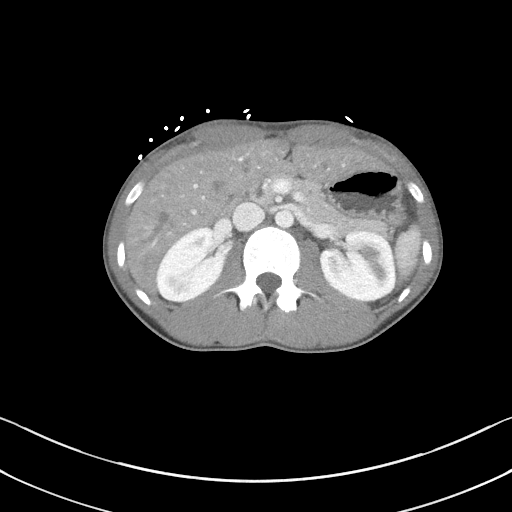
[im 72/89  soft-tissue]
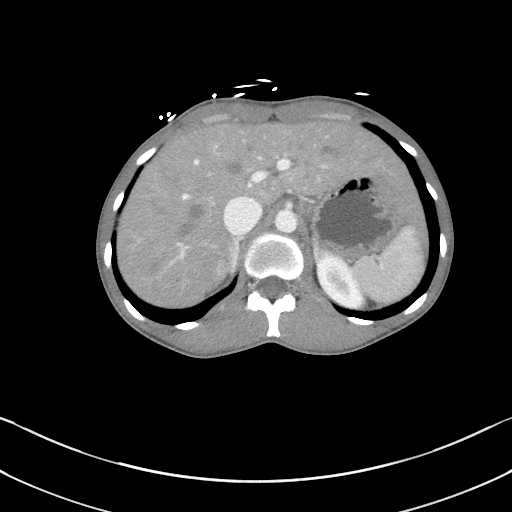
[im 76/89  soft-tissue]
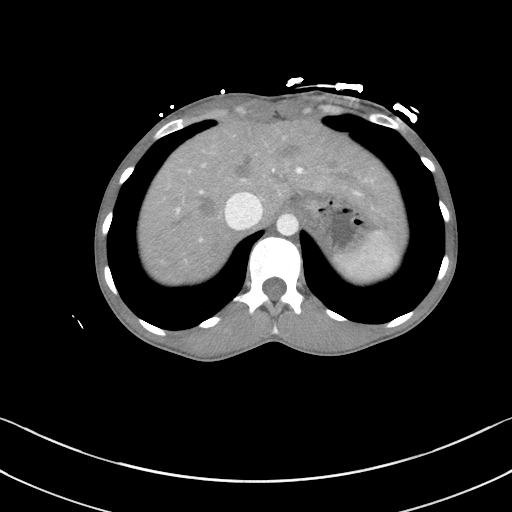
[im 84/89  soft-tissue]
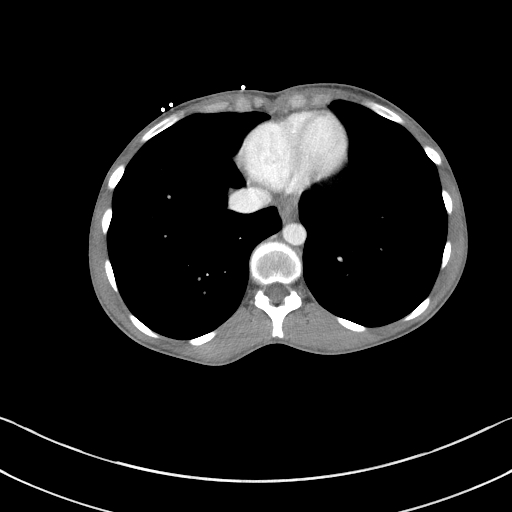

[Series 5: coronal st · coronal · 0.74mm/px · 3 of 89 slices shown]
[im 30/89  soft-tissue]
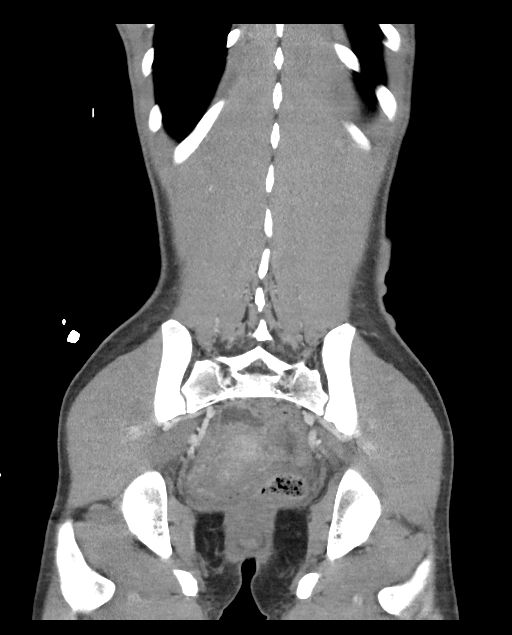
[im 40/89  soft-tissue]
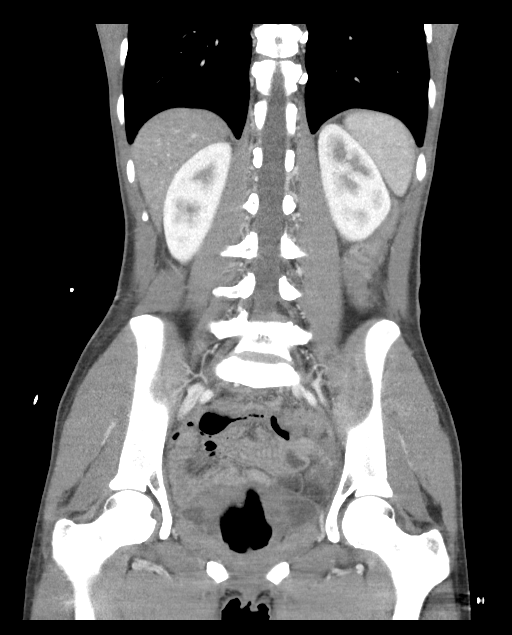
[im 49/89  soft-tissue]
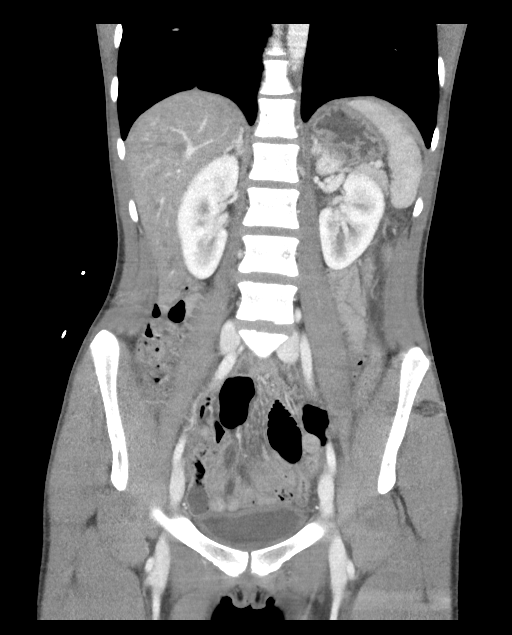

[16 of 46 positions shown; findings below may reference images not displayed]

FINDINGS: Lower chest: No acute abnormality.

Hepatobiliary: 16.7 cm in length slightly steatotic liver. No mass
enhancement. Unremarkable gallbladder and bile ducts.

Pancreas: Unremarkable. No pancreatic ductal dilatation or
surrounding inflammatory changes.

Spleen: Normal.

Adrenals/Urinary Tract: There is no adrenal mass no focal
abnormality in the renal cortex and no calculus or
hydroureteronephrosis. There is mild bladder thickening versus
nondistention.

Stomach/Bowel: There is fold thickening in the proximal and mid
stomach. Fold thickening is seen in multiple mid to lower abdominal
small bowel segments without dilatation. The appendix is normal
caliber. There is wall thickening or underdistention in the
descending colon.

Vascular/Lymphatic: No significant vascular findings are present. No
enlarged abdominal or pelvic lymph nodes.

Reproductive: Uterus and bilateral adnexa are unremarkable. There is
air in the vaginal vault, possibly due to a tampon.

Other: No anterior wall hernia is seen. There is a small amount of
low-density fluid in the pelvic cul-de-sac. There is no free air,
hemorrhage or abscess.

Musculoskeletal: No acute or significant osseous findings.
IMPRESSION: 1. Imaging findings of gastroenteritis. No small bowel obstruction
or mesenteric focal inflammatory changes.
2. Descending colitis versus nondistention.
3. Minimal low-density fluid in the pelvic cul-de-sac.
4. Cystitis versus bladder nondistention. No findings of
pyelonephritis.

## 2023-04-07 IMAGING — DX DG CHEST 2V
2 series · 2 of 2 positions shown · non-contrast
Comparison: None.

CLINICAL DATA: Nausea and vomiting with cough.

EXAM:
CHEST - 2 VIEW

[chest pa]
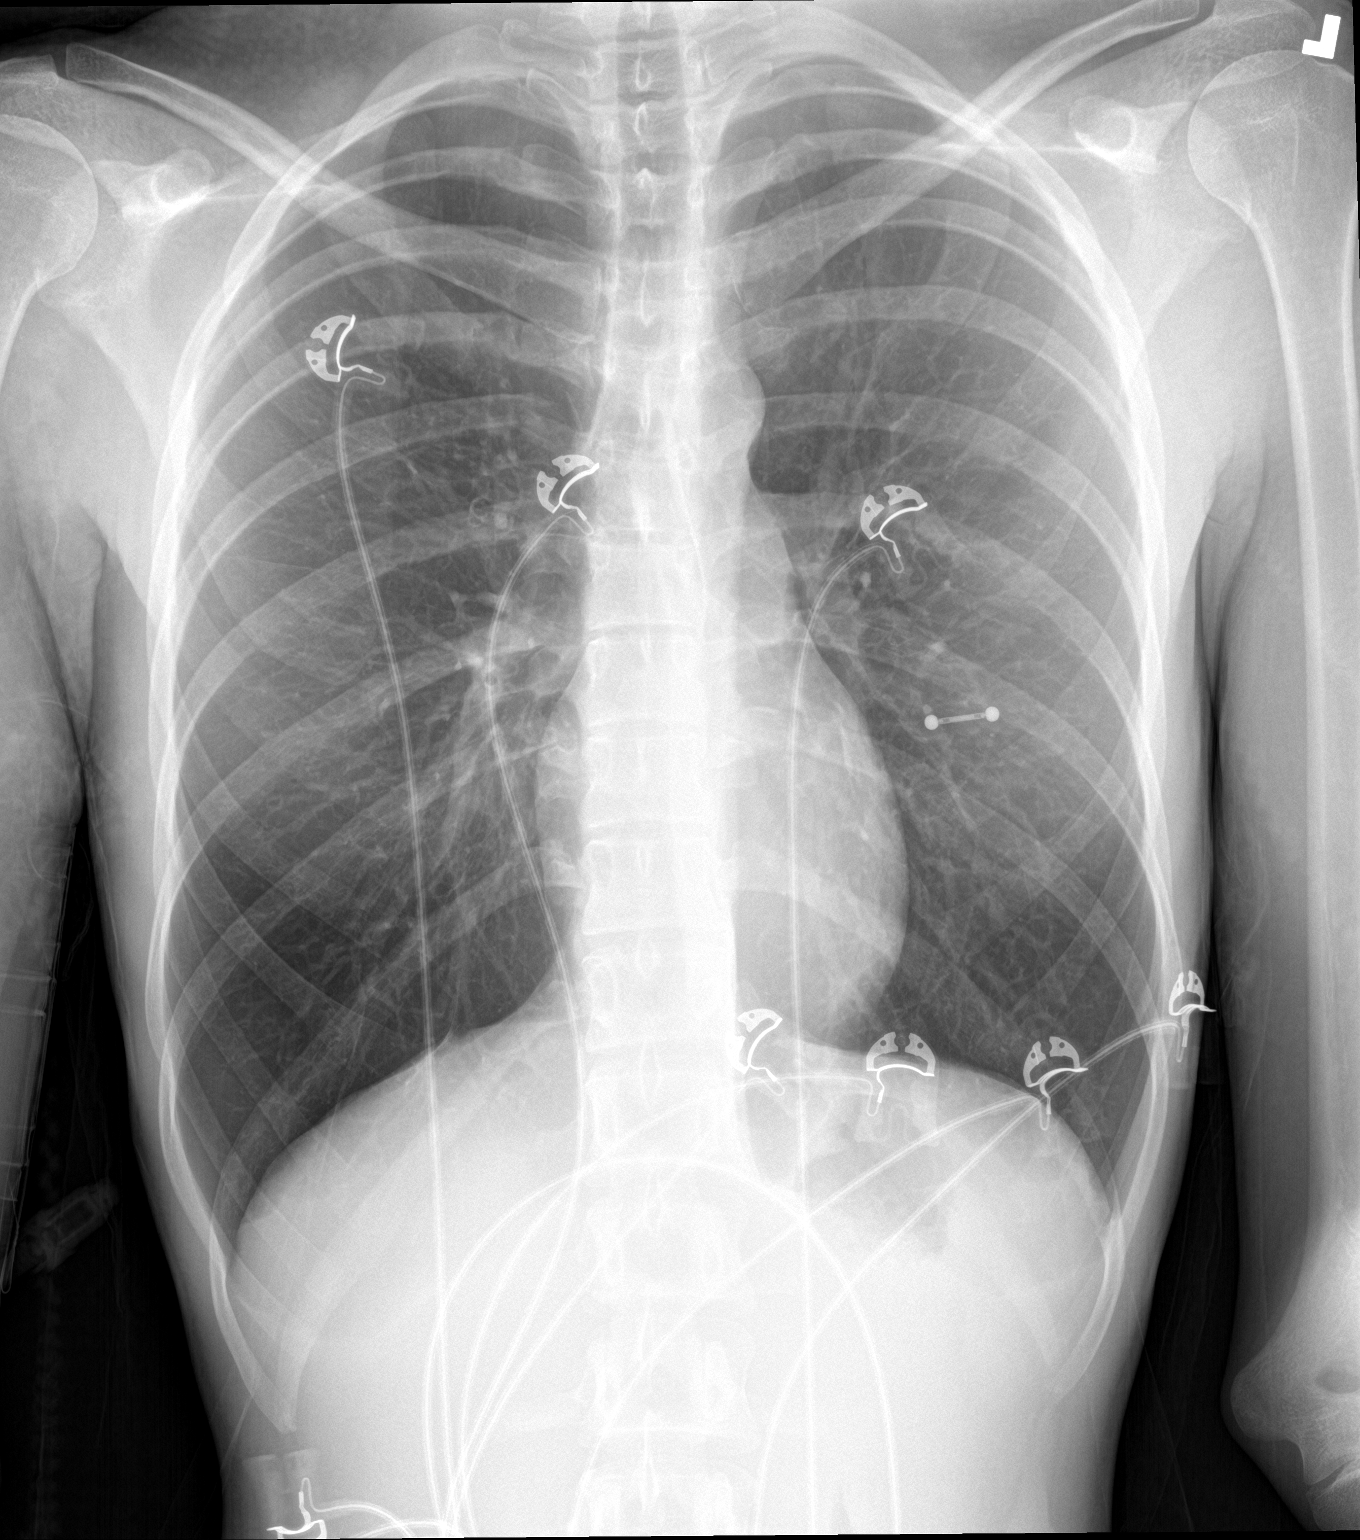

[chest lat]
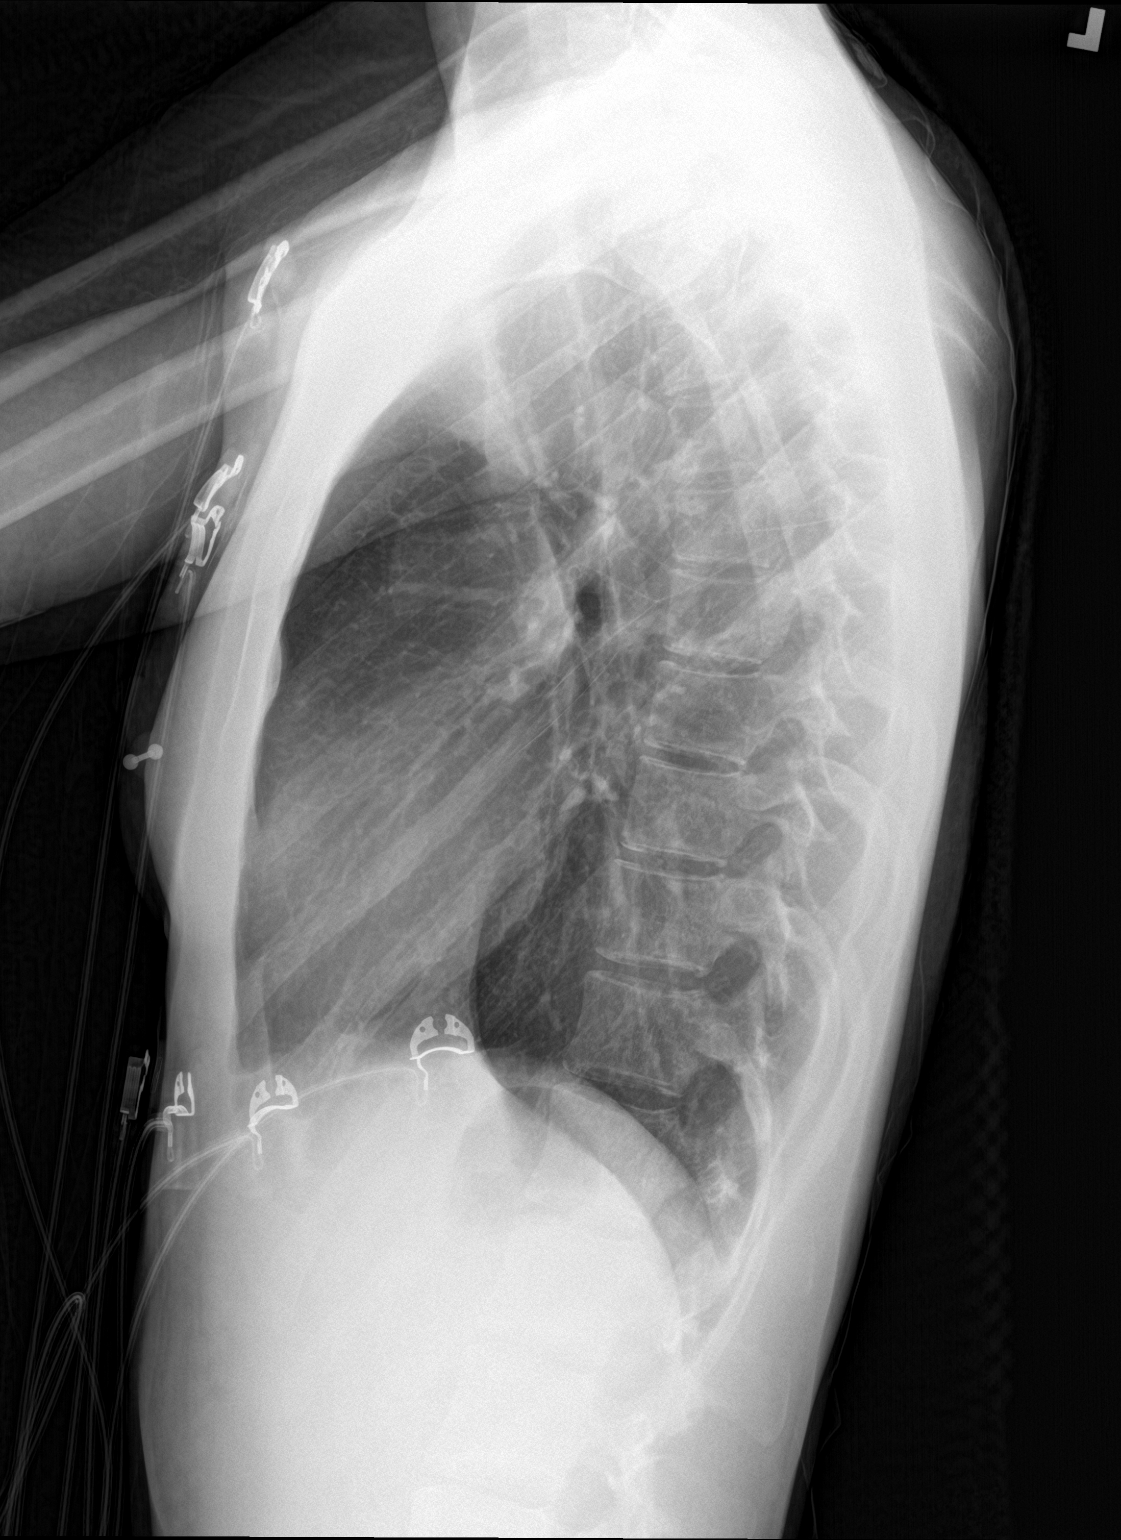

[2 of 2 positions shown; findings below may reference images not displayed]

FINDINGS: The heart size and mediastinal contours are within normal limits.
Both lungs are clear. A radiopaque nipple piercing is seen on the
left. The visualized skeletal structures are unremarkable.
IMPRESSION: No active cardiopulmonary disease.

## 2024-06-30 ENCOUNTER — Encounter (HOSPITAL_COMMUNITY): Payer: Self-pay

## 2024-06-30 ENCOUNTER — Ambulatory Visit (HOSPITAL_COMMUNITY)
Admission: EM | Admit: 2024-06-30 | Discharge: 2024-06-30 | Disposition: A | Discharge status: Home / Self Care | Attending: Family Medicine | Admitting: Family Medicine

## 2024-06-30 DIAGNOSIS — H53143 Visual discomfort, bilateral: Secondary | ICD-10-CM | POA: Diagnosis not present

## 2024-06-30 DIAGNOSIS — Z973 Presence of spectacles and contact lenses: Secondary | ICD-10-CM | POA: Diagnosis not present

## 2024-06-30 MED ORDER — TETRACAINE HCL 0.5 % OP SOLN
OPHTHALMIC | Status: AC
  Filled 2024-06-30: qty 4

## 2024-06-30 MED ORDER — FLUORESCEIN SODIUM 1 MG OP STRP
ORAL_STRIP | OPHTHALMIC | Status: AC
  Filled 2024-06-30: qty 1

## 2024-06-30 NOTE — ED Provider Notes (Signed)
°  [facility]   [id number] 06/30/24 Arrival Time: 0907  ASSESSMENT & PLAN:  1. Photophobia of both eyes   2. Uses contact lenses    Spoke with Dr Charmayne on call for ophthalmology. He will see her this afternoon.   Follow-up Information     Go to  Charmayne Molly, MD.   Specialty: Ophthalmology Why: 1 o'clock today Contact information: [address] [place] [address] [phone]                 Reviewed expectations re: course of current medical issues. Questions answered. Outlined signs and symptoms indicating need for more acute intervention. Patient verbalized understanding. After Visit Summary given.   SUBJECTIVE:  Diana Perkins is a 28 y.o. female who presents with complaint of bilateral photophobia; x 2-3 weeks; gradually getting worse; has been wearing contact lenses up until a few days ago. Denies any specific pain except when stepping into a brightly lit area. Denies visual changes. Denies eye trauma. No tx PTA.  OBJECTIVE:  Vitals:   06/30/24 0937  BP: 99/65  Pulse: 75  Resp: 16  Temp: 98.2 F (36.8 C)  TempSrc: Oral  SpO2: 98%    General appearance: alert; no distress HEENT: [place]; AT; PERRLA; no restriction of the extraocular movements OU: without reported pain; without conjunctival injection; with watery drainage; without gross corneal opacities; with the slightest limbal flush; without periorbital swelling or erythema; fluorescein  without corneal uptake Neck: supple Skin: warm and dry Psychological: alert and cooperative; normal mood and affect    Visual Acuity  Right Eye Distance: 20/200 Left Eye Distance: 20/200 (Wears corrective lenses) Bilateral Distance: 20/200  Right Eye Near:   Left Eye Near:    Bilateral Near:     Allergies[1]  History reviewed. No pertinent past medical history. Social History   Socioeconomic History   Marital status: Single    Spouse name: Not on file   Number of children: Not on  file   Years of education: Not on file   Highest education level: Not on file  Occupational History   Not on file  Tobacco Use   Smoking status: Never   Smokeless tobacco: Never  Vaping Use   Vaping status: Never Used  Substance and Sexual Activity   Alcohol use: Yes   Drug use: Not Currently   Sexual activity: Never  Other Topics Concern   Not on file  Social History Narrative   Not on file   Social Drivers of Health   Tobacco Use: Low Risk (06/30/2024)   Patient History    Smoking Tobacco Use: Never    Smokeless Tobacco Use: Never    Passive Exposure: Not on file  Financial Resource Strain: Not on file  Food Insecurity: Not on file  Transportation Needs: Not on file  Physical Activity: Not on file  Stress: Not on file  Social Connections: Not on file  Intimate Partner Violence: Not on file  Depression (EYV7-0): Not on file  Alcohol Screen: Not on file  Housing: Not on file  Utilities: Not on file  Health Literacy: Not on file   History reviewed. No pertinent family history. History reviewed. No pertinent surgical history.     [1]  Allergies Allergen Reactions   Amoxicillin Other (See Comments)    unknown     Rolinda Rogue, MD 06/30/24 1310

## 2024-06-30 NOTE — Discharge Instructions (Signed)
 I

## 2024-06-30 NOTE — ED Triage Notes (Addendum)
 Pt has c/o bilateral eye pain that started 2 weeks ago and got progressively worse within the last week. Denies redness and swelling, and has some light sensitivity, hasn't taken any medications.
